# Patient Record
Sex: Female | Born: 1985 | Race: White | Hispanic: No | Smoking: Former smoker
Health system: Southern US, Community
[De-identification: ages and names within clinical notes are randomized; demographics above are authoritative.]

## PROBLEM LIST (undated history)

## (undated) HISTORY — PX: WISDOM TOOTH EXTRACTION: SHX21

---

## 2005-03-29 ENCOUNTER — Emergency Department: Payer: Self-pay | Admitting: Emergency Medicine

## 2011-06-11 ENCOUNTER — Ambulatory Visit: Payer: Self-pay | Admitting: Internal Medicine

## 2012-04-26 ENCOUNTER — Ambulatory Visit: Payer: Self-pay

## 2012-12-19 ENCOUNTER — Ambulatory Visit: Payer: Self-pay | Admitting: Otolaryngology

## 2012-12-19 LAB — HCG, QUANTITATIVE, PREGNANCY: Beta Hcg, Quant.: <1 m[IU]/mL — ABNORMAL LOW

## 2012-12-21 ENCOUNTER — Ambulatory Visit: Payer: Self-pay | Admitting: Otolaryngology

## 2013-01-01 ENCOUNTER — Emergency Department: Payer: Self-pay | Admitting: Emergency Medicine

## 2013-01-01 LAB — CBC
HCT: 40.6 % (ref 35.0–47.0)
HGB: 14 g/dL (ref 12.0–16.0)
MCH: 30.3 pg (ref 26.0–34.0)
MCHC: 34.3 g/dL (ref 32.0–36.0)
MCV: 88 fL (ref 80–100)
Platelet: 268 10*3/uL (ref 150–440)
RBC: 4.6 10*6/uL (ref 3.80–5.20)
RDW: 13.5 % (ref 11.5–14.5)

## 2013-01-01 LAB — BASIC METABOLIC PANEL
Anion Gap: 6 — ABNORMAL LOW (ref 7–16)
BUN: 11 mg/dL (ref 7–18)
Calcium, Total: 9.2 mg/dL (ref 8.5–10.1)
Co2: 25 mmol/L (ref 21–32)
EGFR (Non-African Amer.): >60
Osmolality: 275 (ref 275–301)
Sodium: 138 mmol/L (ref 136–145)

## 2013-01-01 LAB — TROPONIN I: Troponin-I: <0.02 ng/mL

## 2013-01-01 LAB — CK TOTAL AND CKMB (NOT AT ARMC): CK-MB: <0.5 ng/mL — ABNORMAL LOW (ref 0.5–3.6)

## 2013-02-21 ENCOUNTER — Other Ambulatory Visit: Payer: Self-pay | Admitting: Internal Medicine

## 2013-02-21 DIAGNOSIS — IMO0001 Reserved for inherently not codable concepts without codable children: Secondary | ICD-10-CM

## 2013-02-28 ENCOUNTER — Ambulatory Visit
Admission: RE | Admit: 2013-02-28 | Discharge: 2013-02-28 | Disposition: A | Discharge status: Home / Self Care | Payer: BC Managed Care – PPO | Source: Ambulatory Visit | Attending: Internal Medicine | Admitting: Internal Medicine

## 2013-02-28 DIAGNOSIS — IMO0001 Reserved for inherently not codable concepts without codable children: Secondary | ICD-10-CM

## 2013-03-15 ENCOUNTER — Ambulatory Visit: Payer: Self-pay | Admitting: Otolaryngology

## 2014-04-04 ENCOUNTER — Ambulatory Visit: Payer: Self-pay | Admitting: Physician Assistant

## 2014-04-04 LAB — BASIC METABOLIC PANEL
Anion Gap: 7 (ref 7–16)
BUN: 7 mg/dL (ref 7–18)
CO2: 27 mmol/L (ref 21–32)
Calcium, Total: 8.7 mg/dL (ref 8.5–10.1)
Chloride: 106 mmol/L (ref 98–107)
Creatinine: 0.91 mg/dL (ref 0.60–1.30)
Glucose: 93 mg/dL (ref 65–99)
Osmolality: 277 (ref 275–301)
Potassium: 3.5 mmol/L (ref 3.5–5.1)
Sodium: 140 mmol/L (ref 136–145)

## 2014-04-04 LAB — CBC WITH DIFFERENTIAL/PLATELET
BASOS ABS: 0.1 10*3/uL (ref 0.0–0.1)
BASOS PCT: 0.5 %
Eosinophil #: 0 10*3/uL (ref 0.0–0.7)
Eosinophil %: 0.2 %
HCT: 41.2 % (ref 35.0–47.0)
HGB: 13.5 g/dL (ref 12.0–16.0)
LYMPHS ABS: 1.4 10*3/uL (ref 1.0–3.6)
Lymphocyte %: 12.4 %
MCH: 29.7 pg (ref 26.0–34.0)
MCHC: 32.7 g/dL (ref 32.0–36.0)
MCV: 91 fL (ref 80–100)
MONOS PCT: 8.2 %
Monocyte #: 0.9 x10 3/mm (ref 0.2–0.9)
Neutrophil #: 8.7 10*3/uL — ABNORMAL HIGH (ref 1.4–6.5)
Neutrophil %: 78.7 %
PLATELETS: 214 10*3/uL (ref 150–440)
RBC: 4.53 10*6/uL (ref 3.80–5.20)
RDW: 13.5 % (ref 11.5–14.5)
WBC: 11.1 10*3/uL — AB (ref 3.6–11.0)

## 2014-07-26 NOTE — Op Note (Signed)
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   PROCEDURES: 1.  Septoplasty.  2.  Bilateral submucous resection of the inferior turbinates.   SURGEON: Zackery BarefootJ. Madison Kerryann Allaire, MD   ANESTHESIA: General.  FINDINGS: The septum was deviated to the left inferiorly, back to the right more superiorly. There was a posterior spur at the junction of the perpendicular plate of the ethmoid and vomer. The inferior turbinates were massively hypertrophied. A total of 1 unit of Surgiflo was placed.   COMPLICATIONS: None.  DESCRIPTION OF PROCEDURE:  The patient was identified in the holding area and was brought back to the operating room in the supine position on the operating room table.  After general endotracheal anesthesia had been induced the patient was turned 90 degrees counter clockwise from anesthesia.  The nose was anesthetized with infraorbital nerve blocks and septal injection with 0.5% Lidocaine and 0.25% Bupivacaine mixed with 1:150,000 with Epinephrine and phenylephrine Lidocaine soaked pledgets, two on each side were placed and the face was prepped and draped in the usual fashion.  The pledgets were removed.  A 15 blade was used to make a left-sided hemitransfixion incision and septal mucoperichondrial mucoperiosteal leaflets elevated.  There was a large inferior spur that was resected with Jansen-Middleton forceps.  The remaining septum was deviated back and forth in an accordion like fashion.  The bony cartilaginous junction was then divided and a moderate amount of vomer and perpendicular plate was taken down with Jansen-Middleton forceps, releasing the tension on the remaining septum.  The septum then  swung back into the midline.  The septal leaflets were closed with quilting 4-0 chromic suture.  The left sided hemitransfixion incision was closed with 4-0 plain gut.  Attention was directed to the turbinates which had been previously injected on the left.  The head of the inferior turbinate on the left was incised with a 15 blade and the medial mucoperiosteum was elevated using a Risk analystCottle elevator.  Once this had been elevated Knight scissors were used to resect the conchal bone and lateral mucoperiosteum.  The inferior margin of the remaining mucoperiosteum was then cauterized with suction cautery and Surgiflo was placed at the inferior to the inferior margin of the remaining inferior turbinate.  An identical procedure was performed on the right inferior turbinate with once again placement of Surgiflo along its inferior margin.  Temporary Telfa pledgets were then placed.  The patient was allowed to emerge from anesthesia, extubated in the operating room and taken to the recovery room in stable condition.  There were no complications.  Estimated blood loss was less than 10 milliliters.   ____________________________ Shela CommonsJ. Gertie BaronMadison Mystique Bjelland, MD [email or web address] D: 03/15/2013 15:45:10 ET T: 03/15/2013 16:07:35 ET JOB#: [id number]  cc: Zackery BarefootJ. Madison Louie Meaders, MD, <Dictator> Wendee CoppJMADISON Alante Tolan MD ELECTRONICALLY SIGNED 03/16/2013 8:17

## 2016-07-28 IMAGING — CR DG CHEST 2V
1 series · 2 of 2 positions shown · non-contrast
Comparison: PA and lateral chest x-ray January 01, 2013

CLINICAL DATA: Cough for 1 month not responsive to antibiotics;
fever chills, and sore throat since yesterday

EXAM:
CHEST  2 VIEW

[Series 1: dxr chest pa (or ap) and lateral · 0.14mm/px · 2 of 2 slices shown]
[im 1/2]
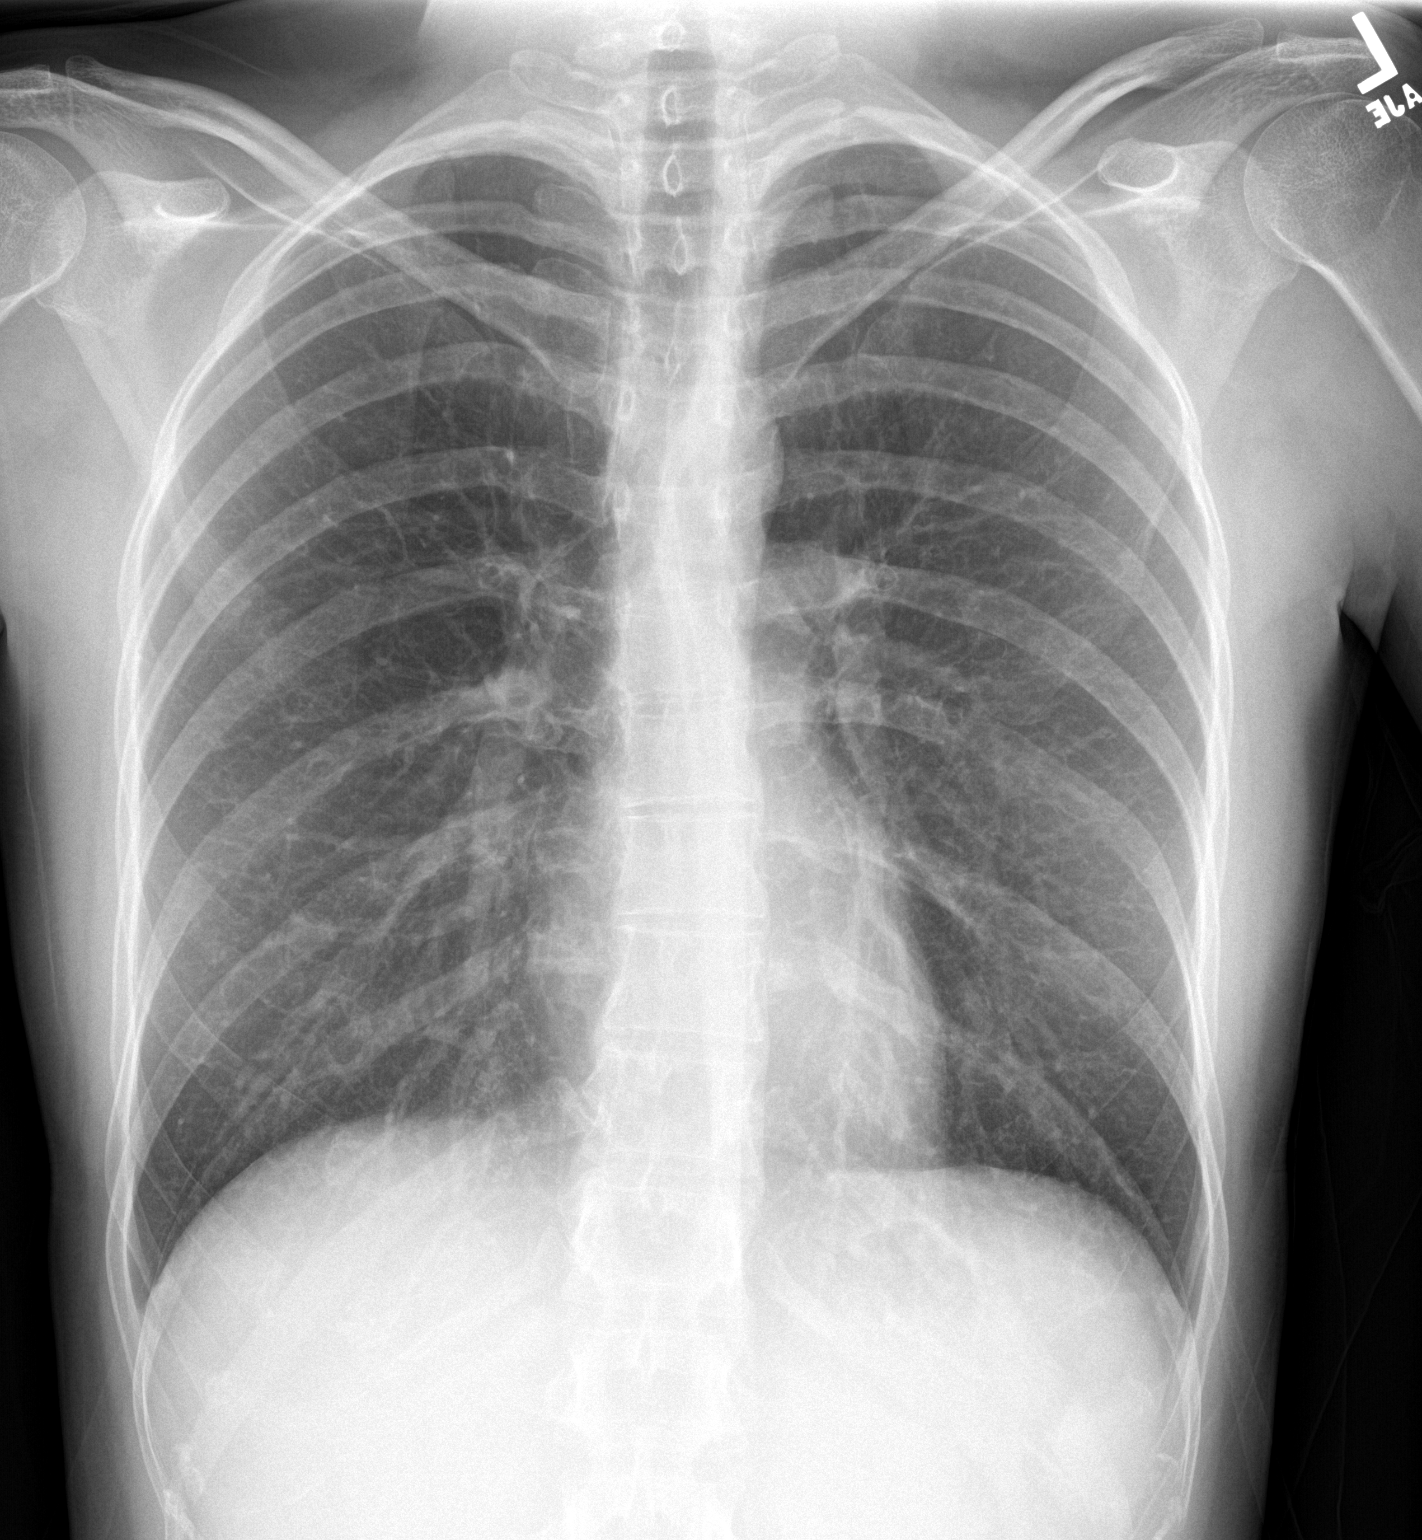
[im 2/2]
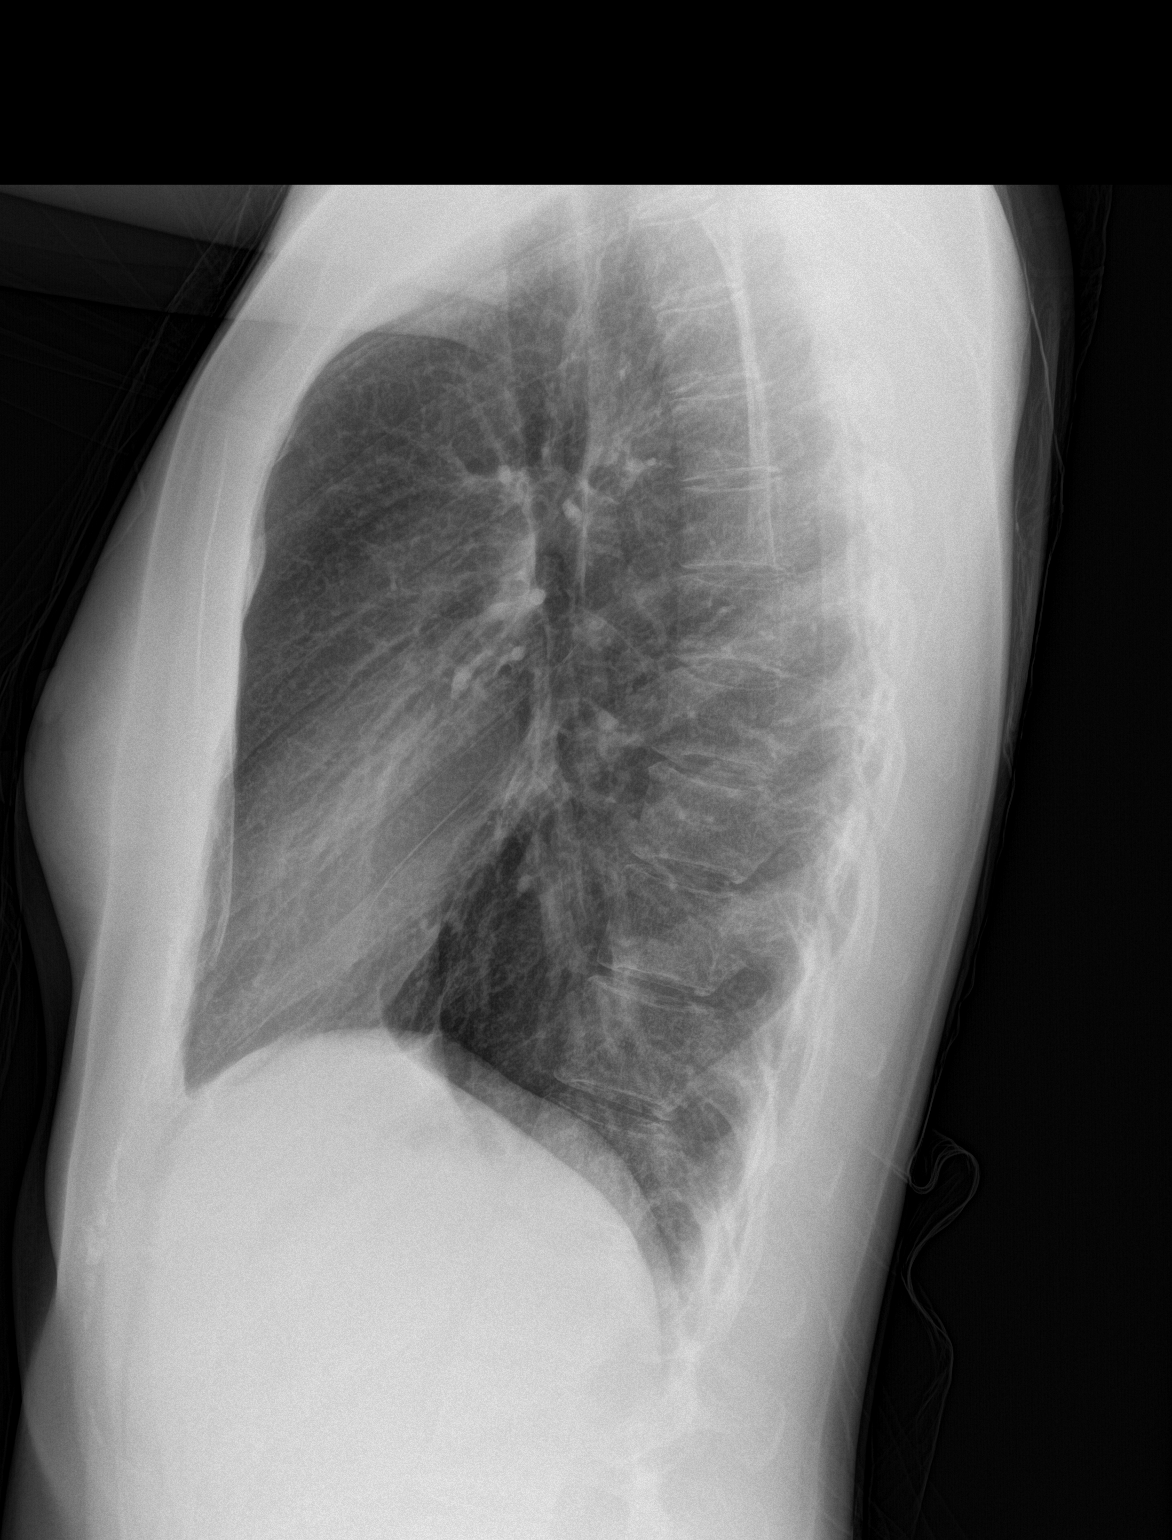

[2 of 2 positions shown; findings below may reference images not displayed]

FINDINGS: The lungs remain hyperinflated. There is no focal infiltrate. The
interstitial markings are coarse bilaterally but not significantly
changed. The heart and pulmonary vascularity are normal. The
mediastinum is normal in width. There is no pleural effusion or
pneumothorax. The bony thorax is unremarkable.
IMPRESSION: Mild hyperinflation may be voluntary or could reflect underlying
reactive airway disease. The interstitial markings are coarse though
stable and may reflect bronchitic change or be related to chronic
tobacco use.

## 2016-09-14 ENCOUNTER — Ambulatory Visit
Admission: EM | Admit: 2016-09-14 | Discharge: 2016-09-14 | Disposition: A | Discharge status: Home / Self Care | Payer: 59 | Attending: Family Medicine | Admitting: Family Medicine

## 2016-09-14 DIAGNOSIS — B349 Viral infection, unspecified: Secondary | ICD-10-CM | POA: Diagnosis not present

## 2016-09-14 MED ORDER — MUPIROCIN 2 % EX OINT: 1.0000 "application " | TOPICAL_OINTMENT | Freq: Three times a day (TID) | CUTANEOUS | 0 refills | Status: AC

## 2016-09-14 MED ORDER — NAPROXEN 500 MG PO TABS: 500.0000 mg | ORAL_TABLET | Freq: Two times a day (BID) | ORAL | 0 refills | Status: AC

## 2016-09-14 NOTE — ED Provider Notes (Signed)
CSN: [id number]     Arrival date & time 09/14/16  1759 History   None    Chief Complaint  Patient presents with  . Headache  . Generalized Body Aches   (Consider location/radiation/quality/duration/timing/severity/associated sxs/prior Treatment) HPI  Is a 31 year old female who is accompanied by her mother. She states that for 2 days she's had fever headache and body aches. She does not offer any specific focus. She denies nausea vomiting or urinary symptoms sneezing sore throat cough. She states that she spent the weekend with her boyfriend who lives in the woods and her outside quite frequently she has 2 small bites on the popliteal fossa area of her left leg but she does not remember any specific bite and did not remove any insects or ticks. She's had high fevers up to 100 to according to her but she is afebrile the present time.       History reviewed. No pertinent past medical history. Past Surgical History:  Procedure Laterality Date  . WISDOM TOOTH EXTRACTION     Family History  Problem Relation Age of Onset  . Osler-Weber-Rendu syndrome Mother    Social History  Substance Use Topics  . Smoking status: Former Games developer  . Smokeless tobacco: Never Used  . Alcohol use No   OB History    No data available     Review of Systems  Constitutional: Positive for activity change, appetite change, chills, fatigue and fever.  Musculoskeletal: Positive for myalgias.  All other systems reviewed and are negative.   Allergies  Patient has no known allergies.  Home Medications   Prior to Admission medications   Medication Sig Start Date End Date Taking? Authorizing Provider  escitalopram (LEXAPRO) 10 MG tablet Take 10 mg by mouth daily.   Yes [provider]  Levonorgestrel-Ethinyl Estrad (LILLOW PO) Take by mouth.   Yes [provider]  traZODone (DESYREL) 50 MG tablet Take 50 mg by mouth at bedtime.   Yes [provider]  mupirocin ointment  (BACTROBAN) 2 % Apply 1 application topically 3 (three) times daily. 09/14/16   Lutricia Feil, PA-C  naproxen (NAPROSYN) 500 MG tablet Take 1 tablet (500 mg total) by mouth 2 (two) times daily with a meal. 09/14/16   Lutricia Feil, PA-C   Meds Ordered and Administered this Visit  Medications - No data to display  BP 107/62 (BP Location: Left Arm)   Pulse 66   Temp 98.4 F (36.9 C) (Oral)   Resp 18   Ht 5\' 11"  (1.803 m)   Wt 168 lb (76.2 kg)   SpO2 99%   BMI 23.43 kg/m  No data found.   Physical Exam  Constitutional: She is oriented to person, place, and time. She appears well-developed and well-nourished. No distress.  HENT:  Head: Normocephalic.  Right Ear: External ear normal.  Left Ear: External ear normal.  Nose: Nose normal.  Mouth/Throat: Oropharynx is clear and moist. No oropharyngeal exudate.  Eyes: Pupils are equal, round, and reactive to light. Right eye exhibits no discharge. Left eye exhibits no discharge.  Neck: Normal range of motion.  Pulmonary/Chest: Effort normal and breath sounds normal.  Musculoskeletal: Normal range of motion.  Neurological: She is alert and oriented to person, place, and time.  Skin: Skin is warm and dry. She is not diaphoretic.  Psychiatric: She has a normal mood and affect. Her behavior is normal. Judgment and thought content normal.  Nursing note and vitals reviewed.   Urgent Care Course  Procedures (including critical care time)  Labs Review Labs Reviewed - No data to display  Imaging Review No results found.   Visual Acuity Review  Right Eye Distance:   Left Eye Distance:   Bilateral Distance:    Right Eye Near:   Left Eye Near:    Bilateral Near:         MDM   1. Viral illness    Discharge Medication List as of 09/14/2016  8:10 PM    START taking these medications   Details  mupirocin ointment (BACTROBAN) 2 % Apply 1 application topically 3 (three) times daily., Starting Tue 09/14/2016, Normal     naproxen (NAPROSYN) 500 MG tablet Take 1 tablet (500 mg total) by mouth 2 (two) times daily with a meal., Starting Tue 09/14/2016, Normal      Plan: 1. Test/x-ray results and diagnosis reviewed with patient 2. rx as per orders; risks, benefits, potential side effects reviewed with patient 3. Recommend supportive treatment with Rest and fluids. I discussed with the patient and her mother that there is no focus for her symptoms. For it is very likely a viral illness that stress course. I recommended that she have rest and fluids use Naprosyn for headache and fever and body aches. She will put Bactroban on the wound behind her left knee. I recommend she follow-up with her primary care she's not improving in several days. Now his return to our clinic if she is unable to get an appointment with her primary care. 4. F/u prn if symptoms worsen or don't improve     Lutricia FeilRoemer, Kristiann Noyce P, PA-C 09/14/16 2206

## 2016-09-14 NOTE — ED Triage Notes (Signed)
Pt c/o fever, headache and body aches for 2 days.

## 2017-04-26 ENCOUNTER — Other Ambulatory Visit: Payer: Self-pay

## 2017-04-26 MED ORDER — TRAZODONE HCL 50 MG PO TABS: 50.0000 mg | ORAL_TABLET | Freq: Every day | ORAL | 1 refills | Status: DC

## 2017-05-02 ENCOUNTER — Ambulatory Visit: Payer: Self-pay | Admitting: Internal Medicine

## 2017-07-04 ENCOUNTER — Other Ambulatory Visit: Payer: Self-pay

## 2017-07-04 MED ORDER — TRAZODONE HCL 50 MG PO TABS: 50.0000 mg | ORAL_TABLET | Freq: Every day | ORAL | 1 refills | Status: DC

## 2017-07-05 ENCOUNTER — Other Ambulatory Visit: Payer: Self-pay

## 2017-07-05 MED ORDER — TRAZODONE HCL 50 MG PO TABS: 50.0000 mg | ORAL_TABLET | Freq: Every day | ORAL | 0 refills | Status: AC

## 2017-09-14 ENCOUNTER — Other Ambulatory Visit: Payer: Self-pay | Admitting: *Deleted

## 2017-09-14 MED ORDER — ESCITALOPRAM OXALATE 10 MG PO TABS: 10.0000 mg | ORAL_TABLET | Freq: Every day | ORAL | 1 refills | Status: AC

## 2017-10-11 ENCOUNTER — Other Ambulatory Visit: Payer: Self-pay

## 2017-10-11 MED ORDER — LEVONORGESTREL-ETHINYL ESTRAD 0.15-30 MG-MCG PO TABS: 1.0000 | ORAL_TABLET | Freq: Every day | ORAL | 0 refills | Status: AC

## 2017-10-11 NOTE — Telephone Encounter (Signed)
Spoke with pt she moved and she going to change her pcp but she don't have anyone mow as per heather only send 30 days birth control med

## 2021-11-02 ENCOUNTER — Other Ambulatory Visit: Payer: Self-pay | Admitting: Chiropractic Medicine

## 2021-11-02 ENCOUNTER — Ambulatory Visit
Admission: RE | Admit: 2021-11-02 | Discharge: 2021-11-02 | Disposition: A | Discharge status: Home / Self Care | Payer: Commercial Managed Care - PPO | Source: Ambulatory Visit | Attending: Chiropractic Medicine | Admitting: Chiropractic Medicine

## 2021-11-02 DIAGNOSIS — M545 Low back pain, unspecified: Secondary | ICD-10-CM

## 2021-11-02 DIAGNOSIS — M542 Cervicalgia: Secondary | ICD-10-CM
